# Patient Record
Sex: Male | Born: 1965 | Race: White | Hispanic: No | Marital: Married | Smoking: Never smoker
Health system: Southern US, Community
[De-identification: ages and names within clinical notes are randomized; demographics above are authoritative.]

## PROBLEM LIST (undated history)

## (undated) DIAGNOSIS — K219 Gastro-esophageal reflux disease without esophagitis: Secondary | ICD-10-CM

## (undated) DIAGNOSIS — E785 Hyperlipidemia, unspecified: Secondary | ICD-10-CM

## (undated) DIAGNOSIS — E119 Type 2 diabetes mellitus without complications: Secondary | ICD-10-CM

## (undated) DIAGNOSIS — K5790 Diverticulosis of intestine, part unspecified, without perforation or abscess without bleeding: Secondary | ICD-10-CM

## (undated) DIAGNOSIS — G510 Bell's palsy: Secondary | ICD-10-CM

## (undated) DIAGNOSIS — I10 Essential (primary) hypertension: Secondary | ICD-10-CM

## (undated) HISTORY — DX: Bell's palsy: G51.0

## (undated) HISTORY — PX: COLOSTOMY: SHX63

## (undated) HISTORY — DX: Diverticulosis of intestine, part unspecified, without perforation or abscess without bleeding: K57.90

## (undated) HISTORY — DX: Type 2 diabetes mellitus without complications: E11.9

## (undated) HISTORY — DX: Essential (primary) hypertension: I10

## (undated) HISTORY — DX: Gastro-esophageal reflux disease without esophagitis: K21.9

## (undated) HISTORY — DX: Hyperlipidemia, unspecified: E78.5

## (undated) HISTORY — PX: OTHER SURGICAL HISTORY: SHX169

---

## 2014-01-24 HISTORY — PX: COLONOSCOPY: SHX174

## 2020-03-13 ENCOUNTER — Other Ambulatory Visit: Payer: Self-pay | Admitting: *Deleted

## 2020-04-07 ENCOUNTER — Other Ambulatory Visit: Payer: Self-pay

## 2020-04-07 ENCOUNTER — Ambulatory Visit (AMBULATORY_SURGERY_CENTER): Payer: BC Managed Care – PPO

## 2020-04-07 VITALS — Ht 72.0 in | Wt 282.0 lb

## 2020-04-07 DIAGNOSIS — Z86010 Personal history of colonic polyps: Secondary | ICD-10-CM

## 2020-04-07 MED ORDER — PEG 3350-KCL-NA BICARB-NACL 420 G PO SOLR: 4000.0000 mL | Freq: Once | ORAL | 0 refills | Status: AC

## 2020-04-07 NOTE — Progress Notes (Signed)
No allergies to soy or egg Pt is not on blood thinners or diet pills Denies issues with sedation/intubation Denies atrial flutter/fib Denies constipation    Pt is aware of Covid safety and care partner requirements.   Pt is on metamucil-was instructed not to take 5 days before the procedure.  Instructed to not use chewing tobacco the day of the procedure-from midnight until after procedure.

## 2020-04-09 ENCOUNTER — Encounter: Payer: Self-pay | Admitting: Gastroenterology

## 2020-04-16 ENCOUNTER — Encounter: Payer: Self-pay | Admitting: Gastroenterology

## 2020-05-11 ENCOUNTER — Other Ambulatory Visit: Payer: Self-pay

## 2020-05-11 ENCOUNTER — Encounter: Payer: Self-pay | Admitting: Gastroenterology

## 2020-05-11 ENCOUNTER — Ambulatory Visit (AMBULATORY_SURGERY_CENTER): Payer: BC Managed Care – PPO | Admitting: Gastroenterology

## 2020-05-11 VITALS — BP 107/72 | HR 64 | Temp 98.6°F | Resp 14 | Ht 72.0 in | Wt 282.0 lb

## 2020-05-11 DIAGNOSIS — Z86010 Personal history of colonic polyps: Secondary | ICD-10-CM | POA: Diagnosis present

## 2020-05-11 DIAGNOSIS — D123 Benign neoplasm of transverse colon: Secondary | ICD-10-CM

## 2020-05-11 MED ORDER — SODIUM CHLORIDE 0.9 % IV SOLN: 500.0000 mL | INTRAVENOUS | Status: DC

## 2020-05-11 NOTE — Progress Notes (Signed)
Called to room to assist during endoscopic procedure.  Patient ID and intended procedure confirmed with present staff. Received instructions for my participation in the procedure from the performing physician.  

## 2020-05-11 NOTE — Op Note (Signed)
[facility] Patient Name: Jeremy Clayton Procedure Date: 05/11/2020 8:57 AM MRN: [id number] Endoscopist: Jackquline Denmark , MD Age: 55 Referring MD:  Date of Birth: [birth year] Gender: Male Account #: [id number] Procedure:                Colonoscopy Indications:              High risk colon cancer surveillance: Personal                            history of colonic polyps Medicines:                Monitored Anesthesia Care Procedure:                Pre-Anesthesia Assessment:                           - Prior to the procedure, a History and Physical                            was performed, and patient medications and                            allergies were reviewed. The patient's tolerance of                            previous anesthesia was also reviewed. The risks                            and benefits of the procedure and the sedation                            options and risks were discussed with the patient.                            All questions were answered, and informed consent                            was obtained. Prior Anticoagulants: The patient has                            taken no previous anticoagulant or antiplatelet                            agents. ASA Grade Assessment: II - A patient with                            mild systemic disease. After reviewing the risks                            and benefits, the patient was deemed in                            satisfactory condition to undergo the procedure.  After obtaining informed consent, the colonoscope                            was passed under direct vision. Throughout the                            procedure, the patient's blood pressure, pulse, and                            oxygen saturations were monitored continuously. The                            Olympus CF-HQ190L [id number]) Colonoscope was                            introduced through the anus and advanced to  the 2                            cm into the ileum. The colonoscopy was performed                            without difficulty. The patient tolerated the                            procedure well. The quality of the bowel                            preparation was good. The terminal ileum, ileocecal                            valve, appendiceal orifice, and rectum were                            photographed. Scope In: 9:00:17 AM Scope Out: 9:10:20 AM Scope Withdrawal Time: 0 hours 8 minutes 5 seconds  Total Procedure Duration: 0 hours 10 minutes 3 seconds  Findings:                 A 6 mm polyp was found in the mid transverse colon.                            The polyp was sessile. The polyp was removed with a                            cold snare. Resection and retrieval were complete.                           A few medium-mouthed diverticula were found in the                            neo-sigmoid colon, transverse colon and ascending                            colon.  There was evidence of a prior end-to-side                            colo-colonic anastomosis in the sigmoid colon, 15                            cm from the anal verge. This was patent and was                            characterized by healthy appearing mucosa.                           Non-bleeding internal hemorrhoids were found during                            retroflexion. The hemorrhoids were small.                           The terminal ileum appeared normal.                           The exam was otherwise without abnormality on                            direct and retroflexion views. Complications:            No immediate complications. Estimated Blood Loss:     Estimated blood loss: none. Impression:               - One 6 mm polyp in the mid transverse colon,                            removed with a cold snare. Resected and retrieved.                           - Mild pancolonic  diverticulosis.                           - S/P sigmoid resection with patent anastomoses.                           - Non-bleeding internal hemorrhoids.                           - The examined portion of the ileum was normal.                           - The examination was otherwise normal on direct                            and retroflexion views. Recommendation:           - Patient has a contact number available for                            emergencies. The signs and symptoms of  potential                            delayed complications were discussed with the                            patient. Return to normal activities tomorrow.                            Written discharge instructions were provided to the                            patient.                           - Resume previous diet.                           - Continue present medications.                           - Await pathology results.                           - Repeat colonoscopy for surveillance based on                            pathology results.                           - The findings and recommendations were discussed                            with the patient's family. Jackquline Denmark, MD 05/11/2020 9:19:18 AM This report has been signed electronically.

## 2020-05-11 NOTE — Progress Notes (Signed)
Report given to PACU, vss 

## 2020-05-11 NOTE — Patient Instructions (Signed)
Handouts provided on polyps, diverticulosis and hemorrhoids.   YOU HAD AN ENDOSCOPIC PROCEDURE TODAY AT THE [facility]:   Refer to the procedure report that was given to you for any specific questions about what was found during the examination.  If the procedure report does not answer your questions, please call your gastroenterologist to clarify.  If you requested that your care partner not be given the details of your procedure findings, then the procedure report has been included in a sealed envelope for you to review at your convenience later.  YOU SHOULD EXPECT: Some feelings of bloating in the abdomen. Passage of more gas than usual.  Walking can help get rid of the air that was put into your GI tract during the procedure and reduce the bloating. If you had a lower endoscopy (such as a colonoscopy or flexible sigmoidoscopy) you may notice spotting of blood in your stool or on the toilet paper. If you underwent a bowel prep for your procedure, you may not have a normal bowel movement for a few days.  Please Note:  You might notice some irritation and congestion in your nose or some drainage.  This is from the oxygen used during your procedure.  There is no need for concern and it should clear up in a day or so.  SYMPTOMS TO REPORT IMMEDIATELY:   Following lower endoscopy (colonoscopy or flexible sigmoidoscopy):  Excessive amounts of blood in the stool  Significant tenderness or worsening of abdominal pains  Swelling of the abdomen that is new, acute  Fever of 100F or higher   For urgent or emergent issues, a gastroenterologist can be reached at any hour by calling [phone]. Do not use MyChart messaging for urgent concerns.    DIET:  We do recommend a small meal at first, but then you may proceed to your regular diet.  Drink plenty of fluids but you should avoid alcoholic beverages for 24 hours.  ACTIVITY:  You should plan to take it easy for the rest of today and  you should NOT DRIVE or use heavy machinery until tomorrow (because of the sedation medicines used during the test).    FOLLOW UP: Our staff will call the number listed on your records 48-72 hours following your procedure to check on you and address any questions or concerns that you may have regarding the information given to you following your procedure. If we do not reach you, we will leave a message.  We will attempt to reach you two times.  During this call, we will ask if you have developed any symptoms of COVID 19. If you develop any symptoms (ie: fever, flu-like symptoms, shortness of breath, cough etc.) before then, please call [phone].  If you test positive for Covid 19 in the 2 weeks post procedure, please call and report this information to us.    If any biopsies were taken you will be contacted by phone or by letter within the next 1-3 weeks.  Please call us at [phone] if you have not heard about the biopsies in 3 weeks.    SIGNATURES/CONFIDENTIALITY: You and/or your care partner have signed paperwork which will be entered into your electronic medical record.  These signatures attest to the fact that that the information above on your After Visit Summary has been reviewed and is understood.  Full responsibility of the confidentiality of this discharge information lies with you and/or your care-partner.  

## 2020-05-13 ENCOUNTER — Telehealth: Payer: Self-pay

## 2020-05-13 NOTE — Telephone Encounter (Signed)
  Follow up Call-  Call back number 05/11/2020  Post procedure Call Back phone  # [phone] ext [phone]  Permission to leave phone message Yes     Patient questions:  Do you have a fever, pain , or abdominal swelling? No. Pain Score  0 *  Have you tolerated food without any problems? Yes.    Have you been able to return to your normal activities? Yes.    Do you have any questions about your discharge instructions: Diet   No. Medications  No. Follow up visit  No.  Do you have questions or concerns about your Care? No.  Actions: * If pain score is 4 or above: No action needed, pain <4.  1. Have you developed a fever since your procedure? no  2.   Have you had an respiratory symptoms (SOB or cough) since your procedure? no  3.   Have you tested positive for COVID 19 since your procedure no  4.   Have you had any family members/close contacts diagnosed with the COVID 19 since your procedure?  no   If yes to any of these questions please route to Joylene John, RN and Joella Prince, RN

## 2020-05-21 ENCOUNTER — Encounter: Payer: Self-pay | Admitting: Gastroenterology

## 2021-06-29 ENCOUNTER — Emergency Department: Payer: BC Managed Care – PPO

## 2021-06-29 ENCOUNTER — Other Ambulatory Visit: Payer: Self-pay

## 2021-06-29 ENCOUNTER — Emergency Department
Admission: EM | Admit: 2021-06-29 | Discharge: 2021-06-29 | Disposition: A | Discharge status: Home / Self Care | Payer: BC Managed Care – PPO | Attending: Emergency Medicine | Admitting: Emergency Medicine

## 2021-06-29 ENCOUNTER — Encounter: Payer: Self-pay | Admitting: *Deleted

## 2021-06-29 DIAGNOSIS — M79605 Pain in left leg: Secondary | ICD-10-CM | POA: Insufficient documentation

## 2021-06-29 DIAGNOSIS — I10 Essential (primary) hypertension: Secondary | ICD-10-CM | POA: Diagnosis not present

## 2021-06-29 DIAGNOSIS — E119 Type 2 diabetes mellitus without complications: Secondary | ICD-10-CM | POA: Insufficient documentation

## 2021-06-29 NOTE — ED Triage Notes (Signed)
Pt has left lower leg pain.  Sx for 5 days.  No known injury.  Pt sent from urgent care for eval of dvt.  Pt alert.

## 2021-06-29 NOTE — ED Provider Notes (Signed)
[facility] Provider Note    Event Date/Time   First MD Initiated Contact with Patient 06/29/21 2142     (approximate)   History   Chief Complaint: Leg Pain   HPI  Jeremy Clayton is a 56 y.o. male with a past history of hypertension diabetes.  Who is sent to the ED due to left lower leg pain for the past 5 days, waxing waning, no aggravating or alleviating factors, no trauma, no fever.     Physical Exam   Triage Vital Signs: ED Triage Vitals  Enc Vitals Group     BP 06/29/21 2106 (!) 154/93     Pulse Rate 06/29/21 2106 83     Resp 06/29/21 2106 16     Temp 06/29/21 2106 99 F (37.2 C)     Temp Source 06/29/21 2106 Oral     SpO2 06/29/21 2106 94 %     Weight 06/29/21 2106 (!) 617 lb 4.6 oz (280 kg)     Height 06/29/21 2106 6' (1.829 m)     Head Circumference --      Peak Flow --      Pain Score 06/29/21 2106 10     Pain Loc --      Pain Edu? --      Excl. in [place]? --     Most recent vital signs: Vitals:   06/29/21 2106 06/29/21 2106  BP:  (!) 154/93  Pulse:  83  Resp:  16  Temp: 99 F (37.2 C) 99 F (37.2 C)  SpO2:  94%    General: Awake, no distress.  CV:  Good peripheral perfusion.  Resp:  Normal effort.  Abd:  No distention.  Other:  No lower extremity edema.  Symmetric calf circumference.  No palpable cord, negative Homans' sign.  Achilles tendon intact.  No bony tenderness.  Knee joint stable with full range of motion, no effusion.  No pain with percussion of peroneal nerve at fibular neck.  Distal nerve function intact.  DP and PT pulse, normal distal capillary refill.  No crepitus or inflammatory skin changes. There is a small area of palpable soft tissue muscle tenseness and tenderness which reproduces the pain.  There is 1 visible venous varicosity at the left proximal shin.   ED Results / Procedures / Treatments   Labs (all labs ordered are listed, but only abnormal results are displayed) Labs Reviewed - No data to  display   EKG    RADIOLOGY Ultrasound left lower extremity viewed and interpreted by me, negative for DVT.  Radiology report reviewed.  Discussed with radiologist.   PROCEDURES:  Procedures   MEDICATIONS ORDERED IN ED: Medications - No data to display   IMPRESSION / MDM / [place] / ED COURSE  I reviewed the triage vital signs and the nursing notes.                              Differential diagnosis includes, but is not limited to, DVT, muscle strain, Baker's cyst, tendinitis  Patient's presentation is most consistent with acute complicated illness / injury requiring diagnostic workup.  Patient presents with left leg pain, possible DVT versus benign musculoskeletal condition.  Will obtain ultrasound to evaluate.  Vital signs are normal, no other symptoms, doubt PE, no evidence of arterial occlusion, no phlegmasia.  No signs of infection fracture or dislocation or instability.  Suspect muscle spasm/strain versus superficial thrombophlebitis  Clinical Course as of 06/29/21 2250  Tue Jun 29, 2021  2219 Ultrasound left lower extremity viewed and interpreted by me, appears normal without DVT. [PS]    Clinical Course User Index [PS] Carrie Mew, MD     FINAL CLINICAL IMPRESSION(S) / ED DIAGNOSES   Final diagnoses:  Left leg pain     Rx / DC Orders   ED Discharge Orders     None        Note:  This document was prepared using Dragon voice recognition software and may include unintentional dictation errors.   Carrie Mew, MD 06/29/21 2252

## 2023-05-31 IMAGING — US US EXTREM LOW VENOUS*L*
1 series · 13 of 24 positions shown · non-contrast
Comparison: None Available.
COMPARISON: None Available.

Addendum:
CLINICAL DATA: Left leg pain x5 days.

EXAM:
LEFT LOWER EXTREMITY VENOUS DOPPLER ULTRASOUND
TECHNIQUE: Gray-scale sonography with compression, as well as color and duplex
ultrasound, were performed to evaluate the deep venous system(s)
from the level of the common femoral vein through the popliteal and
proximal calf veins.

[Series 1: us venous img lower uni left (dvt) · portal-venous · 13 of 45 slices shown]
[im 1/45]
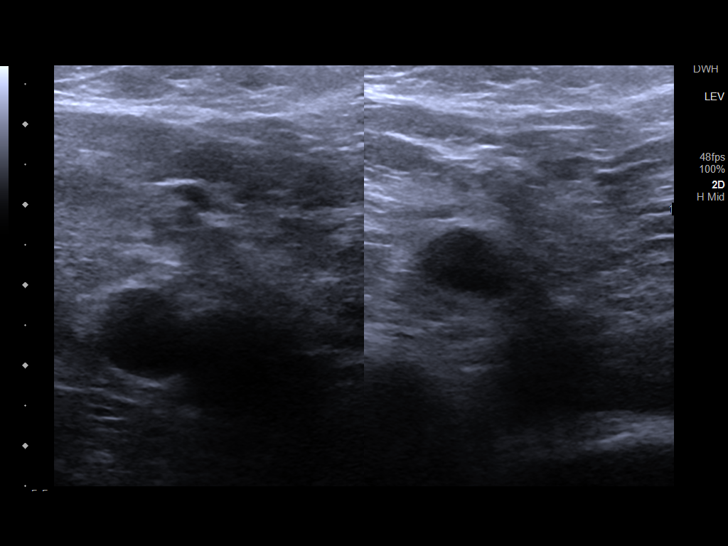
[im 4/45]
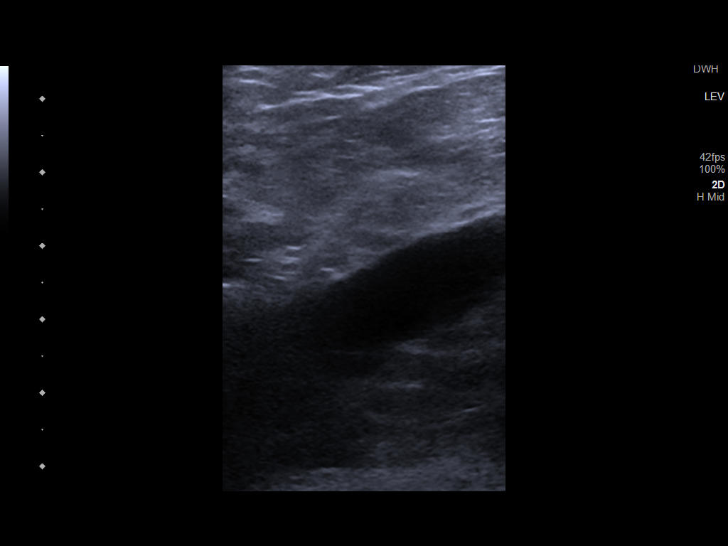
[im 8/45]
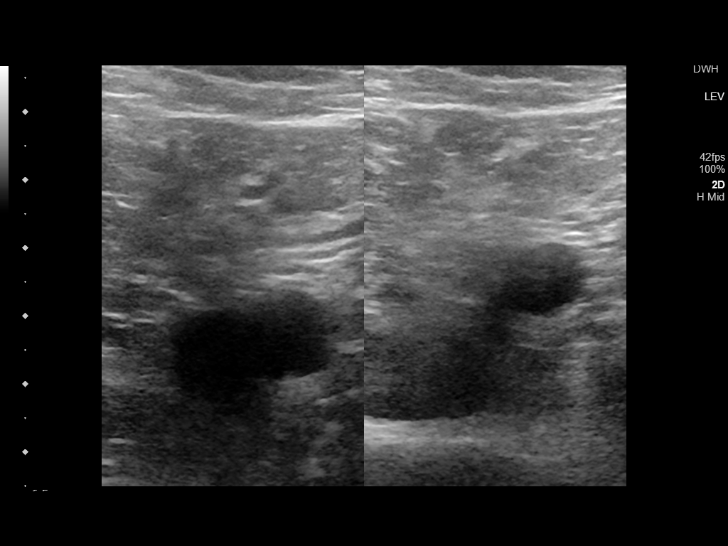
[im 12/45]
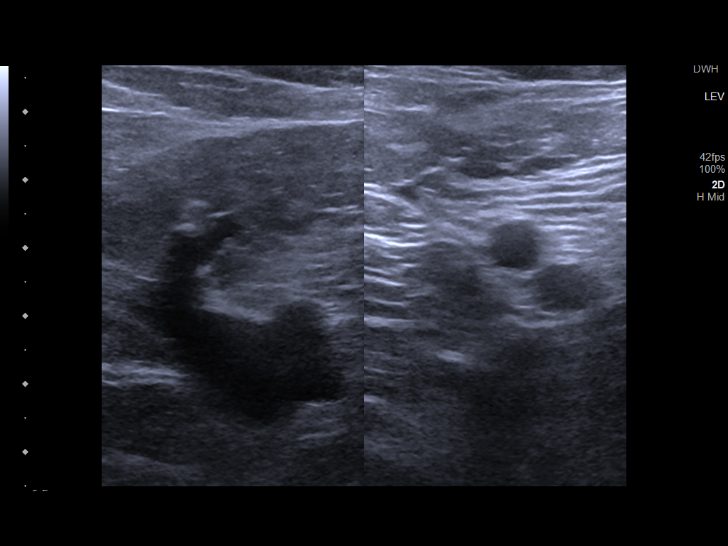
[im 16/45]
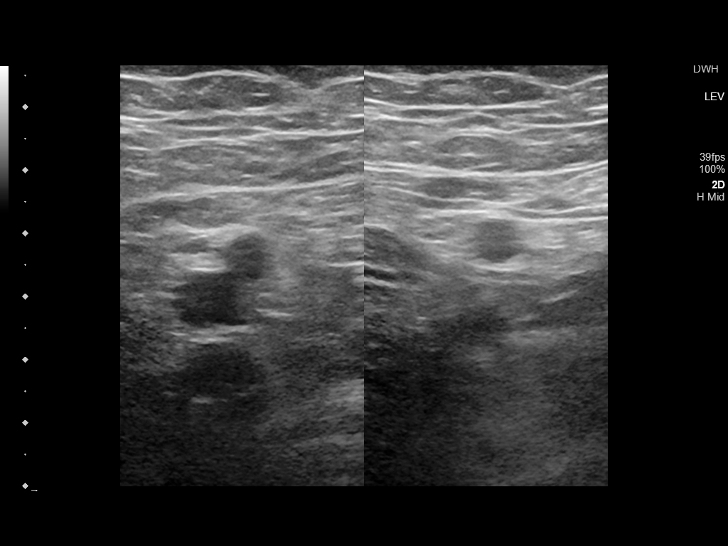
[im 20/45]
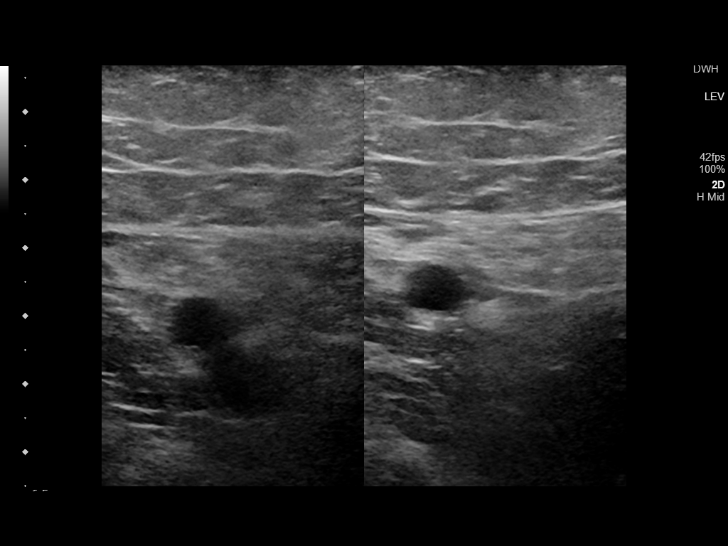
[im 23/45]
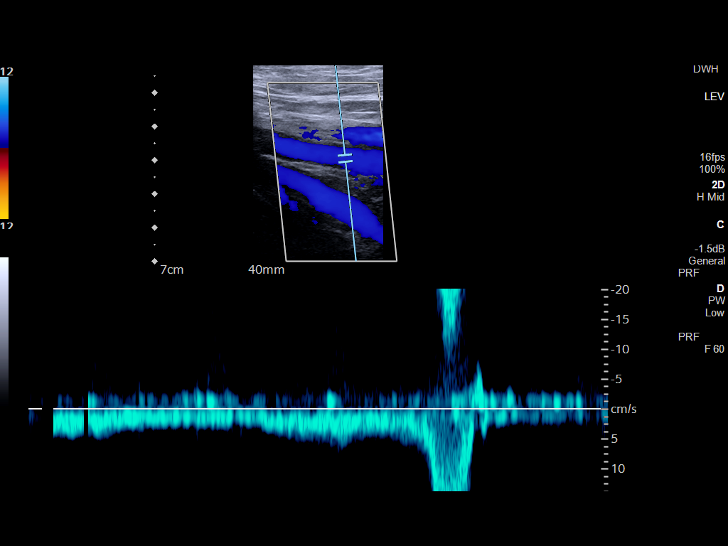
[im 25/45]
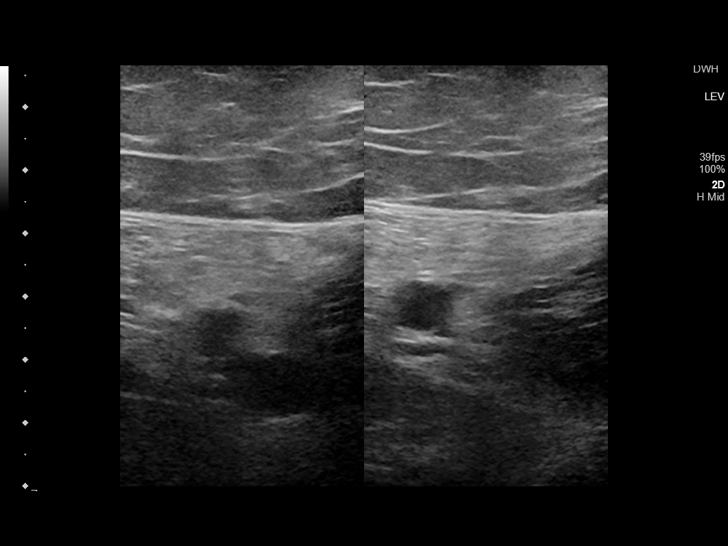
[im 29/45]
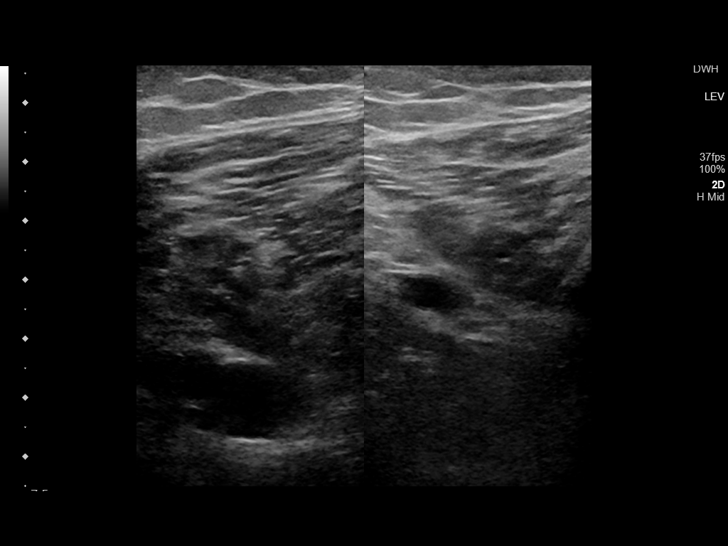
[im 33/45]
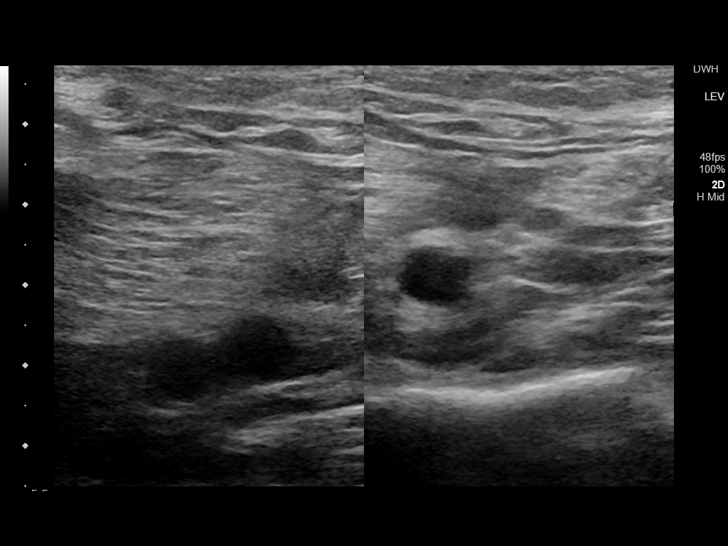
[im 37/45]
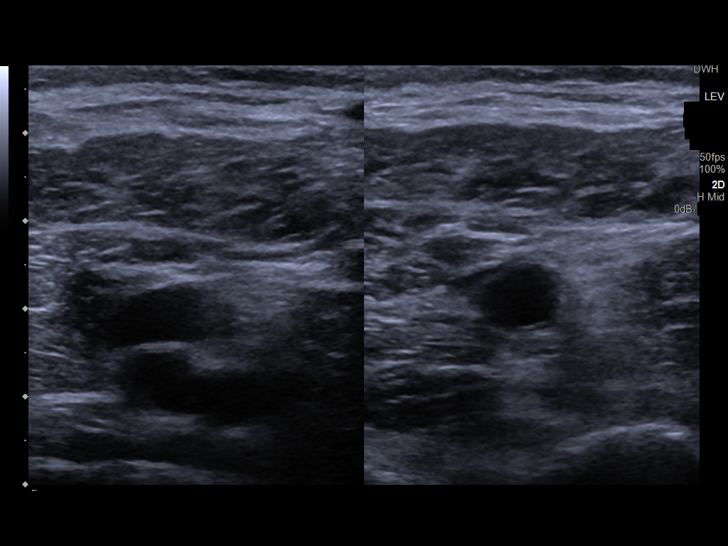
[im 41/45]
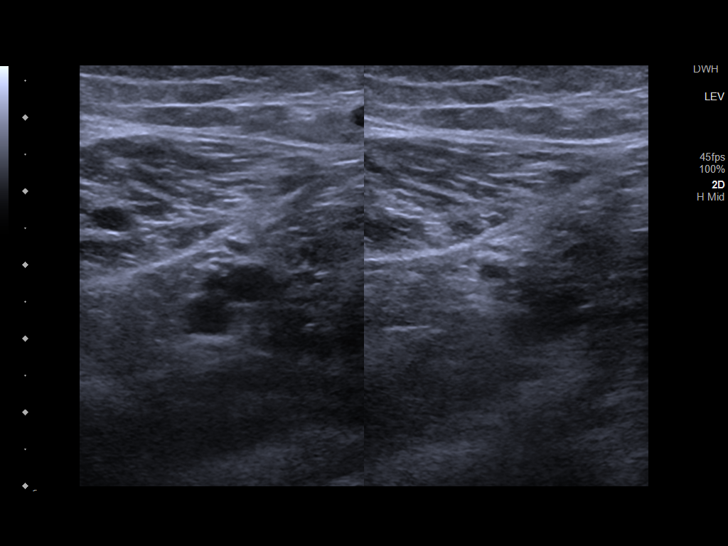
[im 45/45]
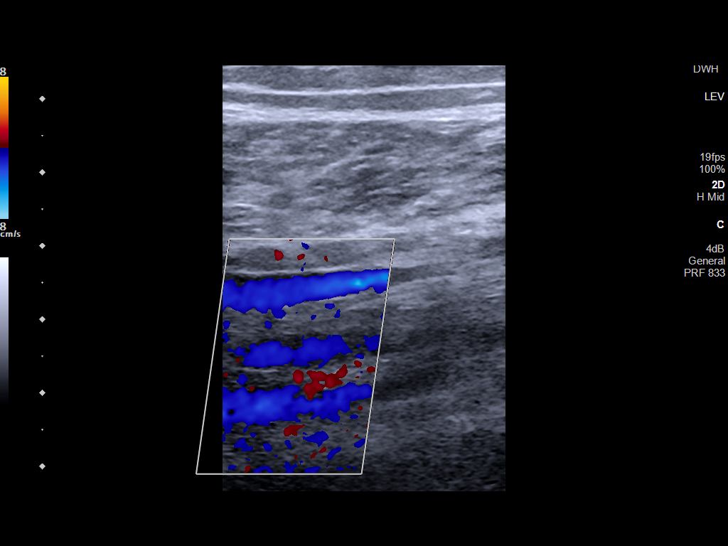

[13 of 24 positions shown; findings below may reference images not displayed]

FINDINGS: VENOUS

Normal compressibility of the common femoral, superficial femoral,
and popliteal veins, as well as the visualized calf veins.
Visualized portions of profunda femoral vein and great saphenous
vein unremarkable. No filling defects to suggest DVT on grayscale or
color Doppler imaging. Doppler waveforms show normal direction of
venous flow, normal respiratory plasticity and response to
augmentation.

Limited views of the contralateral common femoral vein are
unremarkable.

OTHER

None.

Limitations: none
IMPRESSION: Evidence of DVT within the LEFT lower extremity.

ADDENDUM:
No evidence of DVT within the LEFT lower extremity.

The addendum was discussed with Dr. Rashaa Fada at [DATE] p.m.
Eastern on June 29, 2021.

*** End of Addendum ***
FINDINGS: VENOUS

Normal compressibility of the common femoral, superficial femoral,
and popliteal veins, as well as the visualized calf veins.
Visualized portions of profunda femoral vein and great saphenous
vein unremarkable. No filling defects to suggest DVT on grayscale or
color Doppler imaging. Doppler waveforms show normal direction of
venous flow, normal respiratory plasticity and response to
augmentation.

Limited views of the contralateral common femoral vein are
unremarkable.

OTHER

None.

Limitations: none
IMPRESSION: Evidence of DVT within the LEFT lower extremity.
